# Patient Record
Sex: Female | Born: 2005 | Race: Black or African American | Hispanic: No | Marital: Single | Smoking: Never smoker
Health system: Southern US, Community
[De-identification: ages and names within clinical notes are randomized; demographics above are authoritative.]

## PROBLEM LIST (undated history)

## (undated) DIAGNOSIS — J45909 Unspecified asthma, uncomplicated: Secondary | ICD-10-CM

## (undated) DIAGNOSIS — F419 Anxiety disorder, unspecified: Secondary | ICD-10-CM

## (undated) DIAGNOSIS — L309 Dermatitis, unspecified: Secondary | ICD-10-CM

## (undated) HISTORY — PX: NO PAST SURGERIES: SHX2092

## (undated) HISTORY — PX: FOOT SURGERY: SHX648

## (undated) HISTORY — DX: Dermatitis, unspecified: L30.9

## (undated) HISTORY — DX: Anxiety disorder, unspecified: F41.9

---
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
## 2024-10-16 DIAGNOSIS — N6322 Unspecified lump in the left breast, upper inner quadrant: Secondary | ICD-10-CM

## 2024-10-27 ENCOUNTER — Other Ambulatory Visit: Payer: Self-pay

## 2024-10-27 ENCOUNTER — Ambulatory Visit
Admission: RE | Admit: 2024-10-27 | Discharge: 2024-10-27 | Disposition: A | Discharge status: Home / Self Care | Source: Ambulatory Visit

## 2024-10-27 DIAGNOSIS — N6322 Unspecified lump in the left breast, upper inner quadrant: Secondary | ICD-10-CM

## 2024-11-03 ENCOUNTER — Ambulatory Visit: Admission: RE | Admit: 2024-11-03 | Discharge: 2024-11-03 | Disposition: A | Source: Ambulatory Visit

## 2024-11-03 DIAGNOSIS — N6322 Unspecified lump in the left breast, upper inner quadrant: Secondary | ICD-10-CM

## 2024-11-06 LAB — SURGICAL PATHOLOGY
